# Patient Record
Sex: Male | Born: 1960 | Race: White | Hispanic: No | Smoking: Current every day smoker
Health system: Southern US, Community
[De-identification: ages and names within clinical notes are randomized; demographics above are authoritative.]

## PROBLEM LIST (undated history)

## (undated) DIAGNOSIS — G510 Bell's palsy: Secondary | ICD-10-CM

## (undated) HISTORY — DX: Bell's palsy: G51.0

---

## 2007-09-02 ENCOUNTER — Emergency Department (HOSPITAL_COMMUNITY): Admission: EM | Admit: 2007-09-02 | Discharge: 2007-09-02 | Payer: Self-pay | Admitting: *Deleted

## 2014-02-17 ENCOUNTER — Emergency Department (HOSPITAL_COMMUNITY): Payer: BC Managed Care – PPO

## 2014-02-17 ENCOUNTER — Encounter (HOSPITAL_COMMUNITY): Payer: Self-pay | Admitting: Emergency Medicine

## 2014-02-17 ENCOUNTER — Emergency Department (HOSPITAL_COMMUNITY)
Admission: EM | Admit: 2014-02-17 | Discharge: 2014-02-18 | Disposition: A | Discharge status: Home / Self Care | Payer: BC Managed Care – PPO | Attending: Emergency Medicine | Admitting: Emergency Medicine

## 2014-02-17 DIAGNOSIS — R079 Chest pain, unspecified: Secondary | ICD-10-CM

## 2014-02-17 DIAGNOSIS — R911 Solitary pulmonary nodule: Secondary | ICD-10-CM | POA: Insufficient documentation

## 2014-02-17 DIAGNOSIS — F17200 Nicotine dependence, unspecified, uncomplicated: Secondary | ICD-10-CM | POA: Insufficient documentation

## 2014-02-17 DIAGNOSIS — R918 Other nonspecific abnormal finding of lung field: Secondary | ICD-10-CM

## 2014-02-17 DIAGNOSIS — R071 Chest pain on breathing: Secondary | ICD-10-CM | POA: Insufficient documentation

## 2014-02-17 MED ORDER — NITROGLYCERIN 0.4 MG SL SUBL
0.4000 mg | SUBLINGUAL_TABLET | SUBLINGUAL | Status: AC | PRN
Start: 2014-02-17 — End: 2014-02-18
  Administered 2014-02-17 – 2014-02-18 (×3): 0.4 mg via SUBLINGUAL

## 2014-02-17 MED ORDER — ASPIRIN 81 MG PO CHEW
324.0000 mg | CHEWABLE_TABLET | Freq: Once | ORAL | Status: AC
  Administered 2014-02-17: 324 mg via ORAL
  Filled 2014-02-17: qty 4

## 2014-02-17 NOTE — ED Provider Notes (Signed)
CSN: [id number]     Arrival date & time 02/17/14  2309 History   First MD Initiated Contact with Patient 02/17/14 2331 This chart was scribed for Dione Booze, MD by Valera Castle, ED Scribe. This patient was seen in room APA02/APA02 and the patient's care was started at 11:34 PM.     Chief Complaint  Patient presents with  . Chest Pain   (Consider location/radiation/quality/duration/timing/severity/associated sxs/prior Treatment) The history is provided by the patient. No language interpreter was used.   HPI Comments: Oscar Rios is a 53 y.o. male who presents to the Emergency Department complaining of constant, left sided chest pain that radiates into his back, onset 1 hour ago after the pain woke him up from sleeping.  He reports currently having 6/10 chest pain, 9/10 at its worst. He denies his chest pain radiating into his jaw, shoulder. He reports deep breathing exacerbates his chest pain. He denies movement exacerbating his pain. He reports his father had heart problems later in life. His father passed in his 48's. He denies SOB, nausea, vomiting, diaphoresis, cough, and any other associated symptoms. He denies recent long distance travel. He reports h/o smoking 1 PPD. He denies h/o HTN, DM. other medical history. He reports he doesn't go to the doctors much.   PCP - No primary provider on file.   History reviewed. No pertinent past medical history. History reviewed. No pertinent past surgical history. No family history on file. History  Substance Use Topics  . Smoking status: Current Every Day Smoker    Types: Cigarettes  . Smokeless tobacco: Not on file  . Alcohol Use: Yes     Comment: occ    Review of Systems  All other systems reviewed and are negative.  Allergies  Review of patient's allergies indicates no known allergies.  Home Medications   Prior to Admission medications   Not on File   BP 140/81  Pulse 69  Temp(Src) 98.3 F (36.8 C) (Oral)  Resp 22  Ht 5\' 4"   (1.626 m)  Wt 135 lb (61.236 kg)  BMI 23.16 kg/m2  SpO2 99%  Physical Exam  Nursing note and vitals reviewed. Constitutional: He is oriented to person, place, and time. He appears well-developed and well-nourished. No distress.  HENT:  Head: Normocephalic and atraumatic.  Eyes: EOM are normal.  Neck: Neck supple.  Cardiovascular: Normal rate and regular rhythm.   No murmur heard. Pulmonary/Chest: Effort normal and breath sounds normal. No respiratory distress. He has no wheezes. He has no rales. He exhibits tenderness.  Mild left anterior chest wall tenderness.   Musculoskeletal: Normal range of motion.  Neurological: He is alert and oriented to person, place, and time.  Skin: Skin is warm and dry.  Psychiatric: He has a normal mood and affect. His behavior is normal.    ED Course  Procedures (including critical care time)  DIAGNOSTIC STUDIES: Oxygen Saturation is 99% on room air, normal by my interpretation.    COORDINATION OF CARE: 11:38 PM-Discussed treatment plan which includes Aspirin, Nitroglycerin pills, and blood work with pt at bedside and pt agreed to plan.   Results for orders placed during the hospital encounter of 02/17/14  CBC WITH DIFFERENTIAL      Result Value Ref Range   WBC 6.8  4.0 - 10.5 K/uL   RBC 4.65  4.22 - 5.81 MIL/uL   Hemoglobin 14.3  13.0 - 17.0 g/dL   HCT 40.9  81.1 - 91.4 %   MCV 91.0  78.0 -  100.0 fL   MCH 30.8  26.0 - 34.0 pg   MCHC 33.8  30.0 - 36.0 g/dL   RDW 40.913.5  81.111.5 - 91.415.5 %   Platelets 206  150 - 400 K/uL   Neutrophils Relative % 54  43 - 77 %   Neutro Abs 3.6  1.7 - 7.7 K/uL   Lymphocytes Relative 32  12 - 46 %   Lymphs Abs 2.2  0.7 - 4.0 K/uL   Monocytes Relative 12  3 - 12 %   Monocytes Absolute 0.8  0.1 - 1.0 K/uL   Eosinophils Relative 2  0 - 5 %   Eosinophils Absolute 0.1  0.0 - 0.7 K/uL   Basophils Relative 0  0 - 1 %   Basophils Absolute 0.0  0.0 - 0.1 K/uL  BASIC METABOLIC PANEL      Result Value Ref Range   Sodium  141  137 - 147 mEq/L   Potassium 4.0  3.7 - 5.3 mEq/L   Chloride 103  96 - 112 mEq/L   CO2 24  19 - 32 mEq/L   Glucose, Bld 106 (*) 70 - 99 mg/dL   BUN 17  6 - 23 mg/dL   Creatinine, Ser 7.820.91  0.50 - 1.35 mg/dL   Calcium 9.3  8.4 - 95.610.5 mg/dL   GFR calc non Af Amer >90  >90 mL/min   GFR calc Af Amer >90  >90 mL/min  TROPONIN I      Result Value Ref Range   Troponin I <0.30  <0.30 ng/mL  D-DIMER, QUANTITATIVE      Result Value Ref Range   D-Dimer, Quant 1.73 (*) 0.00 - 0.48 ug/mL-FEU  TROPONIN I      Result Value Ref Range   Troponin I <0.30  <0.30 ng/mL   Dg Chest 2 View  02/18/2014   CLINICAL DATA:  CHEST PAIN  EXAM: CHEST  2 VIEW  COMPARISON:  None.  FINDINGS: Cardiac silhouette within the upper limits normal. There is flattening of the hemidiaphragms. Lungs are clear. No acute osseous abnormalities.  IMPRESSION: COPD without acute cardiopulmonary disease.   Electronically Signed   By: Salome HolmesHector  Cooper M.D.   On: 02/18/2014 01:09   Ct Angio Chest Pe W/cm &/or Wo Cm  02/18/2014   CLINICAL DATA:  chest pain, elevated D-dimer.  EXAM: CT ANGIOGRAPHY CHEST WITH CONTRAST  TECHNIQUE: Multidetector CT imaging of the chest was performed using the standard protocol during bolus administration of intravenous contrast. Multiplanar CT image reconstructions and MIPs were obtained to evaluate the vascular anatomy.  CONTRAST:  100mL OMNIPAQUE IOHEXOL 350 MG/ML SOLN  COMPARISON:  DG CHEST 2 VIEW dated 02/18/2014  FINDINGS: The thoracic is unremarkable.  There is no mediastinal nor hilar masses or adenopathy.  No filling defects within the main, lobar, or segmental pulmonary arteries. There is no thoracic aortic aneurysm nor dissection.  Multiple ill-defined nodular densities scattered throughout the right and left hemithoraces. The areas of mild increased density with linear components in the posterior lung bases. The central airways are patent.  Visualized upper abdominal viscera are unremarkable.  No  aggressive appearing osseous lesions.  Visualized upper abdominal viscera demonstrate no gross abnormalities.  Review of the MIP images confirms the above findings.  IMPRESSION: Multifocal bilateral nodular densities. Differential consideration is bronchopulmonary pneumonitis. Short-term follow-up in 3-4 weeks status post appropriate therapeutic regimen is recommended.  No CT evidence of pulmonary arterial embolic disease.  Scarring versus atelectasis within the lung bases.  Electronically Signed   By: Salome HolmesHector  Cooper M.D.   On: 02/18/2014 03:25   Images viewed by me.    EKG Interpretation   Date/Time:  Tuesday Feb 17 2014 [id number]:[id number]:24 EDT Ventricular Rate:  71 PR Interval:  130 QRS Duration: 82 QT Interval:  378 QTC Calculation: 410 R Axis:   65 Text Interpretation:  Normal sinus rhythm Possible Left atrial enlargement  Borderline ECG No old tracing to compare Confirmed by [facility]  MD, Jeb Schloemer  ([id number]) on 02/17/2014 11:31:51 PM     Medications  aspirin chewable tablet 324 mg (324 mg Oral Given 02/17/14 2352)  nitroGLYCERIN (NITROSTAT) SL tablet 0.4 mg (0.4 mg Sublingual Given 02/18/14 0038)  iohexol (OMNIPAQUE) 350 MG/ML injection 100 mL (100 mLs Intravenous Contrast Given 02/18/14 0159)   MDM   Final diagnoses:  Chest pain  Lung nodules    Chest pain of uncertain cause. Pain is pleuritic in d-dimer will be obtained to rule out pulmonary embolism. You also be given a therapeutic trial of nitroglycerin. He is given initial dose of aspirin.  He did have relief of pain with nitroglycerin. However her ECG is normal and troponin is normal. D-dimer is elevated and CT angiogram is obtained showing some nodules with radiologist is concerned could be infectious. Troponin is repeated and is negative. He has been pain-free since the administration of nitroglycerin. He will need a stress test to evaluate for possible cardiac disease and is referred to [facility] cardiology for outpatient  stress testing. He is given prescriptions for prednisone and azithromycin and is advised that you'll need followup evaluation and make sure his lung nodules have responded. He is advised to take aspirin daily until cleared by cardiology.  I personally performed the services described in this documentation, which was scribed in my presence. The recorded information has been reviewed and is accurate.     Dione Boozeavid Jancie Kercher, MD 02/18/14 [id number]

## 2014-02-17 NOTE — ED Notes (Signed)
Patient states he was awakened by chest pressure about an hour ago.

## 2014-02-18 ENCOUNTER — Emergency Department (HOSPITAL_COMMUNITY): Payer: BC Managed Care – PPO

## 2014-02-18 LAB — CBC WITH DIFFERENTIAL/PLATELET
BASOS ABS: 0 10*3/uL (ref 0.0–0.1)
Basophils Relative: 0 % (ref 0–1)
EOS PCT: 2 % (ref 0–5)
Eosinophils Absolute: 0.1 10*3/uL (ref 0.0–0.7)
HEMATOCRIT: 42.3 % (ref 39.0–52.0)
HEMOGLOBIN: 14.3 g/dL (ref 13.0–17.0)
LYMPHS ABS: 2.2 10*3/uL (ref 0.7–4.0)
LYMPHS PCT: 32 % (ref 12–46)
MCH: 30.8 pg (ref 26.0–34.0)
MCHC: 33.8 g/dL (ref 30.0–36.0)
MCV: 91 fL (ref 78.0–100.0)
MONO ABS: 0.8 10*3/uL (ref 0.1–1.0)
MONOS PCT: 12 % (ref 3–12)
Neutro Abs: 3.6 10*3/uL (ref 1.7–7.7)
Neutrophils Relative %: 54 % (ref 43–77)
Platelets: 206 10*3/uL (ref 150–400)
RBC: 4.65 MIL/uL (ref 4.22–5.81)
RDW: 13.5 % (ref 11.5–15.5)
WBC: 6.8 10*3/uL (ref 4.0–10.5)

## 2014-02-18 LAB — TROPONIN I: Troponin I: <0.3 ng/mL (ref <0.30)

## 2014-02-18 LAB — BASIC METABOLIC PANEL
BUN: 17 mg/dL (ref 6–23)
CO2: 24 mEq/L (ref 19–32)
Calcium: 9.3 mg/dL (ref 8.4–10.5)
Chloride: 103 mEq/L (ref 96–112)
Creatinine, Ser: 0.91 mg/dL (ref 0.50–1.35)
GFR calc Af Amer: >90 mL/min (ref >90)
Glucose, Bld: 106 mg/dL — ABNORMAL HIGH (ref 70–99)
POTASSIUM: 4 meq/L (ref 3.7–5.3)
Sodium: 141 mEq/L (ref 137–147)

## 2014-02-18 LAB — D-DIMER, QUANTITATIVE (NOT AT ARMC): D DIMER QUANT: 1.73 ug{FEU}/mL — AB (ref 0.00–0.48)

## 2014-02-18 MED ORDER — PREDNISONE 20 MG PO TABS: 60.0000 mg | ORAL_TABLET | Freq: Every day | ORAL | Status: DC

## 2014-02-18 MED ORDER — AZITHROMYCIN 250 MG PO TABS: 250.0000 mg | ORAL_TABLET | Freq: Every day | ORAL | Status: DC

## 2014-02-18 MED ORDER — IOHEXOL 350 MG/ML SOLN
100.0000 mL | Freq: Once | INTRAVENOUS | Status: AC | PRN
  Administered 2014-02-18: 100 mL via INTRAVENOUS

## 2014-02-18 MED ORDER — PREDNISONE 50 MG PO TABS: 50.0000 mg | ORAL_TABLET | Freq: Every day | ORAL | Status: DC

## 2014-02-18 NOTE — Discharge Instructions (Signed)
Please take Aspirin 81 mg every day until you are cleared by a cardiologist. Oscar Rios CT scan showed some nodules which may be from infection or inflammation. This needs to be rechecked in one month to make sure it has improved with treatment.   Chest Pain (Nonspecific) It is often hard to give a specific diagnosis for the cause of chest pain. There is always a chance that your pain could be related to something serious, such as a heart attack or a blood clot in the lungs. You need to follow up with your caregiver for further evaluation. CAUSES   Heartburn.  Pneumonia or bronchitis.  Anxiety or stress.  Inflammation around your heart (pericarditis) or lung (pleuritis or pleurisy).  A blood clot in the lung.  A collapsed lung (pneumothorax). It can develop suddenly on its own (spontaneous pneumothorax) or from injury (trauma) to the chest.  Shingles infection (herpes zoster virus). The chest wall is composed of bones, muscles, and cartilage. Any of these can be the source of the pain.  The bones can be bruised by injury.  The muscles or cartilage can be strained by coughing or overwork.  The cartilage can be affected by inflammation and become sore (costochondritis). DIAGNOSIS  Lab tests or other studies, such as X-rays, electrocardiography, stress testing, or cardiac imaging, may be needed to find the cause of your pain.  TREATMENT   Treatment depends on what may be causing your chest pain. Treatment may include:  Acid blockers for heartburn.  Anti-inflammatory medicine.  Pain medicine for inflammatory conditions.  Antibiotics if an infection is present.  You may be advised to change lifestyle habits. This includes stopping smoking and avoiding alcohol, caffeine, and chocolate.  You may be advised to keep your head raised (elevated) when sleeping. This reduces the chance of acid going backward from your stomach into your esophagus.  Most of the time, nonspecific chest pain  will improve within 2 to 3 days with rest and mild pain medicine. HOME CARE INSTRUCTIONS   If antibiotics were prescribed, take your antibiotics as directed. Finish them even if you start to feel better.  For the next few days, avoid physical activities that bring on chest pain. Continue physical activities as directed.  Do not smoke.  Avoid drinking alcohol.  Only take over-the-counter or prescription medicine for pain, discomfort, or fever as directed by your caregiver.  Follow your caregiver's suggestions for further testing if your chest pain does not go away.  Keep any follow-up appointments you made. If you do not go to an appointment, you could develop lasting (chronic) problems with pain. If there is any problem keeping an appointment, you must call to reschedule. SEEK MEDICAL CARE IF:   You think you are having problems from the medicine you are taking. Read your medicine instructions carefully.  Your chest pain does not go away, even after treatment.  You develop a rash with blisters on your chest. SEEK IMMEDIATE MEDICAL CARE IF:   You have increased chest pain or pain that spreads to your arm, neck, jaw, back, or abdomen.  You develop shortness of breath, an increasing cough, or you are coughing up blood.  You have severe back or abdominal pain, feel nauseous, or vomit.  You develop severe weakness, fainting, or chills.  You have a fever. THIS IS AN EMERGENCY. Do not wait to see if the pain will go away. Get medical help at once. Call your local emergency services (911 in [place] Do not drive yourself  to the hospital. MAKE SURE YOU:   Understand these instructions.  Will watch your condition.  Will get help right away if you are not doing well or get worse. Document Released: 06/28/2005 Document Revised: 12/11/2011 Document Reviewed: 04/23/2008 [facility] Patient Information 2014 [place], [place].  Pulmonary Nodule A pulmonary nodule is a small, round growth of  tissue in the lung. Pulmonary nodules can range in size from less than 1/5 inch (4 mm) to a little bigger than an inch (25 mm). Most pulmonary nodules are detected when imaging tests of the lung are being performed for a different problem. Pulmonary nodules are usually not cancerous (benign). However, some pulmonary nodules are cancerous (malignant). Follow-up treatment or testing is based on the size of the pulmonary nodule and your risk of getting lung cancer.  CAUSES Benign pulmonary nodules can be caused by various things. Some of the causes include:   Bacterial, fungal, or viral infections. This is usually an old infection that is no longer active, but it can sometimes be a current, active infection.  A benign mass of tissue.  Inflammation from conditions such as rheumatoid arthritis.   Abnormal blood vessels in the lungs. Malignant pulmonary nodules can result from lung cancer or from cancers that spread to the lung from other places in the body. SIGNS AND SYMPTOMS Pulmonary nodules usually do not cause symptoms. DIAGNOSIS Most often, pulmonary nodules are found incidentally when an X-ray or CT scan is performed to look for some other problem in the lung area. To help determine whether a pulmonary nodule is benign or malignant, your health care provider will take a medical history and order a variety of tests. Tests done may include:   Blood tests.  A skin test called a tuberculin test. This test is used to determine if you have been exposed to the germ that causes tuberculosis.   Chest X-rays. If possible, a new X-ray may be compared with X-rays you have had in the past.   CT scan. This test shows smaller pulmonary nodules more clearly than an X-ray.   Positron emission tomography (PET) scan. In this test, a safe amount of a radioactive substance is injected into the bloodstream. Then, the scan takes a picture of the pulmonary nodule. The radioactive substance is eliminated from  your body in your urine.   Biopsy. A tiny piece of the pulmonary nodule is removed so it can be checked under a microscope. TREATMENT  Pulmonary nodules that are benign normally do not require any treatment because they usually do not cause symptoms or breathing problems. Your health care provider may want to monitor the pulmonary nodule through follow-up CT scans. The frequency of these CT scans will vary based on the size of the nodule and the risk factors for lung cancer. For example, CT scans will need to be done more frequently if the pulmonary nodule is larger and if you have a history of smoking and a family history of cancer. Further testing or biopsies may be done if any follow-up CT scan shows that the size of the pulmonary nodule has increased. HOME CARE INSTRUCTIONS  Only take over-the-counter or prescription medicines as directed by your health care provider.  Keep all follow-up appointments with your health care provider. SEEK MEDICAL CARE IF:  You have trouble breathing when you are active.   You feel sick or unusually tired.   You do not feel like eating.   You lose weight without trying to.   You develop chills  or night sweats.  SEEK IMMEDIATE MEDICAL CARE IF:  You cannot catch your breath, or you begin wheezing.   You cannot stop coughing.   You cough up blood.   You become dizzy or feel like you are going to pass out.   You have sudden chest pain.   You have a fever or persistent symptoms for more than 2 3 days.   You have a fever and your symptoms suddenly get worse. MAKE SURE YOU:  Understand these instructions.  Will watch your condition.  Will get help right away if you are not doing well or get worse. Document Released: 07/16/2009 Document Revised: 05/21/2013 Document Reviewed: 03/10/2013 [facility] Patient Information 2014 [place], [place].  Azithromycin tablets What is this medicine? AZITHROMYCIN (az ith roe MYE sin) is a macrolide  antibiotic. It is used to treat or prevent certain kinds of bacterial infections. It will not work for colds, flu, or other viral infections. This medicine may be used for other purposes; ask your health care provider or pharmacist if you have questions. COMMON BRAND NAME(S): Zithromax Tri-Pak, Zithromax Z-Pak, Zithromax What should I tell my health care provider before I take this medicine? They need to know if you have any of these conditions: -kidney disease -liver disease -irregular heartbeat or heart disease -an unusual or allergic reaction to azithromycin, erythromycin, other macrolide antibiotics, foods, dyes, or preservatives -pregnant or trying to get pregnant -breast-feeding How should I use this medicine? Take this medicine by mouth with a full glass of water. Follow the directions on the prescription label. The tablets can be taken with food or on an empty stomach. If the medicine upsets your stomach, take it with food. Take your medicine at regular intervals. Do not take your medicine more often than directed. Take all of your medicine as directed even if you think your are better. Do not skip doses or stop your medicine early. Talk to your pediatrician regarding the use of this medicine in children. Special care may be needed. Overdosage: If you think you have taken too much of this medicine contact a poison control center or emergency room at once. NOTE: This medicine is only for you. Do not share this medicine with others. What if I miss a dose? If you miss a dose, take it as soon as you can. If it is almost time for your next dose, take only that dose. Do not take double or extra doses. What may interact with this medicine? Do not take this medicine with any of the following medications: -lincomycin This medicine may also interact with the following medications: -amiodarone -antacids -cyclosporine -digoxin -magnesium -nelfinavir -phenytoin -warfarin This list may not  describe all possible interactions. Give your health care provider a list of all the medicines, herbs, non-prescription drugs, or dietary supplements you use. Also tell them if you smoke, drink alcohol, or use illegal drugs. Some items may interact with your medicine. What should I watch for while using this medicine? Tell your doctor or health care professional if your symptoms do not improve. Do not treat diarrhea with over the counter products. Contact your doctor if you have diarrhea that lasts more than 2 days or if it is severe and watery. This medicine can make you more sensitive to the sun. Keep out of the sun. If you cannot avoid being in the sun, wear protective clothing and use sunscreen. Do not use sun lamps or tanning beds/booths. What side effects may I notice from receiving this medicine? Side  effects that you should report to your doctor or health care professional as soon as possible: -allergic reactions like skin rash, itching or hives, swelling of the face, lips, or tongue -confusion, nightmares or hallucinations -dark urine -difficulty breathing -hearing loss -irregular heartbeat or chest pain -pain or difficulty passing urine -redness, blistering, peeling or loosening of the skin, including inside the mouth -white patches or sores in the mouth -yellowing of the eyes or skin Side effects that usually do not require medical attention (report to your doctor or health care professional if they continue or are bothersome): -diarrhea -dizziness, drowsiness -headache -stomach upset or vomiting -tooth discoloration -vaginal irritation This list may not describe all possible side effects. Call your doctor for medical advice about side effects. You may report side effects to FDA at 1-800-FDA-1088. Where should I keep my medicine? Keep out of the reach of children. Store at room temperature between 15 and 30 degrees C (59 and 86 degrees F). Throw away any unused medicine after  the expiration date. NOTE: This sheet is a summary. It may not cover all possible information. If you have questions about this medicine, talk to your doctor, pharmacist, or health care provider.  2014, Elsevier/Gold Standard. (2013-03-12 15:42:07)  Prednisone tablets What is this medicine? PREDNISONE (PRED ni sone) is a corticosteroid. It is commonly used to treat inflammation of the skin, joints, lungs, and other organs. Common conditions treated include asthma, allergies, and arthritis. It is also used for other conditions, such as blood disorders and diseases of the adrenal glands. This medicine may be used for other purposes; ask your health care provider or pharmacist if you have questions. COMMON BRAND NAME(S): Deltasone, Predone, Sterapred DS, Sterapred What should I tell my health care provider before I take this medicine? They need to know if you have any of these conditions: -Cushing's syndrome -diabetes -glaucoma -heart disease -high blood pressure -infection (especially a virus infection such as chickenpox, cold sores, or herpes) -kidney disease -liver disease -mental illness -myasthenia gravis -osteoporosis -seizures -stomach or intestine problems -thyroid disease -an unusual or allergic reaction to lactose, prednisone, other medicines, foods, dyes, or preservatives -pregnant or trying to get pregnant -breast-feeding How should I use this medicine? Take this medicine by mouth with a glass of water. Follow the directions on the prescription label. Take this medicine with food. If you are taking this medicine once a day, take it in the morning. Do not take more medicine than you are told to take. Do not suddenly stop taking your medicine because you may develop a severe reaction. Your doctor will tell you how much medicine to take. If your doctor wants you to stop the medicine, the dose may be slowly lowered over time to avoid any side effects. Talk to your pediatrician  regarding the use of this medicine in children. Special care may be needed. Overdosage: If you think you have taken too much of this medicine contact a poison control center or emergency room at once. NOTE: This medicine is only for you. Do not share this medicine with others. What if I miss a dose? If you miss a dose, take it as soon as you can. If it is almost time for your next dose, talk to your doctor or health care professional. You may need to miss a dose or take an extra dose. Do not take double or extra doses without advice. What may interact with this medicine? Do not take this medicine with any of the following medications: -  metyrapone -mifepristone This medicine may also interact with the following medications: -aminoglutethimide -amphotericin B -aspirin and aspirin-like medicines -barbiturates -certain medicines for diabetes, like glipizide or glyburide -cholestyramine -cholinesterase inhibitors -cyclosporine -digoxin -diuretics -ephedrine -male hormones, like estrogens and birth control pills -isoniazid -ketoconazole -NSAIDS, medicines for pain and inflammation, like ibuprofen or naproxen -phenytoin -rifampin -toxoids -vaccines -warfarin This list may not describe all possible interactions. Give your health care provider a list of all the medicines, herbs, non-prescription drugs, or dietary supplements you use. Also tell them if you smoke, drink alcohol, or use illegal drugs. Some items may interact with your medicine. What should I watch for while using this medicine? Visit your doctor or health care professional for regular checks on your progress. If you are taking this medicine over a prolonged period, carry an identification card with your name and address, the type and dose of your medicine, and your doctor's name and address. This medicine may increase your risk of getting an infection. Tell your doctor or health care professional if you are around anyone with  measles or chickenpox, or if you develop sores or blisters that do not heal properly. If you are going to have surgery, tell your doctor or health care professional that you have taken this medicine within the last twelve months. Ask your doctor or health care professional about your diet. You may need to lower the amount of salt you eat. This medicine may affect blood sugar levels. If you have diabetes, check with your doctor or health care professional before you change your diet or the dose of your diabetic medicine. What side effects may I notice from receiving this medicine? Side effects that you should report to your doctor or health care professional as soon as possible: -allergic reactions like skin rash, itching or hives, swelling of the face, lips, or tongue -changes in emotions or moods -changes in vision -depressed mood -eye pain -fever or chills, cough, sore throat, pain or difficulty passing urine -increased thirst -swelling of ankles, feet Side effects that usually do not require medical attention (report to your doctor or health care professional if they continue or are bothersome): -confusion, excitement, restlessness -headache -nausea, vomiting -skin problems, acne, thin and shiny skin -trouble sleeping -weight gain This list may not describe all possible side effects. Call your doctor for medical advice about side effects. You may report side effects to FDA at 1-800-FDA-1088. Where should I keep my medicine? Keep out of the reach of children. Store at room temperature between 15 and 30 degrees C (59 and 86 degrees F). Protect from light. Keep container tightly closed. Throw away any unused medicine after the expiration date. NOTE: This sheet is a summary. It may not cover all possible information. If you have questions about this medicine, talk to your doctor, pharmacist, or health care provider.  2014, Elsevier/Gold Standard. (2011-05-04 10:57:14)

## 2014-03-10 ENCOUNTER — Encounter: Payer: Self-pay | Admitting: *Deleted

## 2014-03-13 ENCOUNTER — Telehealth: Payer: Self-pay | Admitting: Cardiology

## 2014-03-13 ENCOUNTER — Ambulatory Visit (INDEPENDENT_AMBULATORY_CARE_PROVIDER_SITE_OTHER): Payer: BC Managed Care – PPO | Admitting: Cardiology

## 2014-03-13 ENCOUNTER — Encounter: Payer: Self-pay | Admitting: Cardiology

## 2014-03-13 VITALS — BP 140/78 | HR 70 | Ht 64.0 in | Wt 132.0 lb

## 2014-03-13 DIAGNOSIS — R079 Chest pain, unspecified: Secondary | ICD-10-CM | POA: Insufficient documentation

## 2014-03-13 DIAGNOSIS — R9389 Abnormal findings on diagnostic imaging of other specified body structures: Secondary | ICD-10-CM

## 2014-03-13 DIAGNOSIS — R918 Other nonspecific abnormal finding of lung field: Secondary | ICD-10-CM | POA: Insufficient documentation

## 2014-03-13 NOTE — Telephone Encounter (Signed)
Left message for pt to call regarding appointment for CT of Chest that was ordered by Dr. Antoine PocheHochrein.

## 2014-03-13 NOTE — Patient Instructions (Addendum)
The current medical regimen is effective;  continue present plan and medications.  Non-Cardiac CT scanning, (CAT scanning), is a noninvasive, special x-ray that produces cross-sectional images of the body using x-rays and a computer at [facility]. CT scans help physicians diagnose and treat medical conditions. For some CT exams, a contrast material is used to enhance visibility in the area of the body being studied. CT scans provide greater clarity and reveal more details than regular x-ray exams.  Follow up as needed.

## 2014-03-13 NOTE — Progress Notes (Signed)
HPI  The patient presents for evaluation of chest discomfort.He has had no past cardiac history. He was in the emergency room in May and I have reviewed these records. He had some chest discomfort that was felt to be somewhat atypical. Because he did have an increased d-dimer we had a chest CT. There was no evidence of pulmonary embolism.  There were however multifocal nodular bilateral densities. He was treated with antibiotics and steroids. He said he immediately had improvement in his symptoms he was having. He said that the symptoms was chest discomfort that he had acutely today he presented. It was somewhat sharp. He felt like he had some difficulty taking a deep breath. However, he had never had this before. He hasn't had this since. He's had no cough fevers or chills. He's had no shortness of breath, PND or orthopnea. He's had no swelling or edema. He is very active gentleman. He works vigorously without bringing on any of the symptoms.  No Known Allergies  No current outpatient prescriptions on file.   No current facility-administered medications for this visit.    PMH:  None  PSH:  None   History   Social History  . Marital Status: Single    Spouse Name: N/A    Number of Children: 2  . Years of Education: N/A   Occupational History  .  Other   Social History Main Topics  . Smoking status: Current Every Day Smoker -- 1.00 packs/day for 25 years    Types: Cigarettes  . Smokeless tobacco: Not on file  . Alcohol Use: Yes     Comment: occ  . Drug Use: No  . Sexual Activity: Not on file   Other Topics Concern  . Not on file   Social History Narrative   Two sons live with him.      ROS:  As stated in the HPI and negative for all other systems.  PHYSICAL EXAM BP 140/78  Pulse 70  Ht 5\' 4"  (1.626 m)  Wt 132 lb (59.875 kg)  BMI 22.65 kg/m2 GENERAL:  Well appearing HEENT:  Pupils equal round and reactive, fundi not visualized, oral mucosa unremarkable NECK:  No  jugular venous distention, waveform within normal limits, carotid upstroke brisk and symmetric, no bruits, no thyromegaly LYMPHATICS:  No cervical, inguinal adenopathy LUNGS:  Clear to auscultation bilaterally BACK:  No CVA tenderness CHEST:  Unremarkable HEART:  PMI not displaced or sustained,S1 and S2 within normal limits, no S3, no S4, no clicks, no rubs, no murmurs ABD:  Flat, positive bowel sounds normal in frequency in pitch, no bruits, no rebound, no guarding, no midline pulsatile mass, no hepatomegaly, no splenomegaly EXT:  2 plus pulses throughout, no edema, no cyanosis no clubbing SKIN:  No rashes no nodules NEURO:  Cranial nerves II through XII grossly intact, motor grossly intact throughout PSYCH:  Cognitively intact, oriented to person place and time   EKG:  Sinus rhythm, rate 71, axis within normal limits, intervals within normal limits, no acute ST-T wave changes.  Possible LAE. 03/13/2014   ASSESSMENT AND PLAN  CHEST PAIN:  This was atypical. It happened 1 day and has not recurred. He otherwise can be very active without bringing on any of this. At this point no further cardiovascular testing is suggested. He needs aggressive risk reduction.  LUNG NODULES:  Per previous recommendations I will plan on repeating a CT to see if there has been resolution of this. If not he'll need to  see a pulmonologist.  TOBACCO ABUSE:  We did discuss this for quite a while. I suggested plans for quitting.

## 2014-03-16 ENCOUNTER — Telehealth: Payer: Self-pay | Admitting: Cardiology

## 2014-03-16 NOTE — Telephone Encounter (Signed)
Left message 03/13/14---03/16/14 regarding appointment for CT of Chest that was ordered by Dr. Antoine PocheHochrein.  This is scheduled for 03/19/14 @ 10:15 am----at [facility]--arrival time is 10:am--clear liquids only 4 hours prior to study.

## 2014-03-17 ENCOUNTER — Telehealth: Payer: Self-pay | Admitting: Cardiology

## 2014-03-17 NOTE — Telephone Encounter (Signed)
Left message regarding patient's Chest CT scan that is scheduled for 03/19/14 @ 10:15 am [facility].  Arrive time is 10:00 am at the registration desk at the main entrance of the hospital----Nothing to eat or drink 4 hrs. Prior to test.  Left call back number is patient has any questions.

## 2014-03-19 ENCOUNTER — Ambulatory Visit (HOSPITAL_COMMUNITY): Payer: BC Managed Care – PPO

## 2014-03-27 ENCOUNTER — Ambulatory Visit (HOSPITAL_COMMUNITY): Payer: BC Managed Care – PPO

## 2014-04-15 ENCOUNTER — Ambulatory Visit: Payer: Self-pay | Admitting: Cardiology

## 2014-09-29 ENCOUNTER — Encounter: Payer: Self-pay | Admitting: Family Medicine

## 2015-10-31 IMAGING — CT CT ANGIO CHEST
1 of 6 series · 5 of 36 positions shown · IV contrast (Omnipaque 300)
Comparison: DG CHEST 2 VIEW dated 02/18/2014

ADDENDUM:
There was a typographical error in the initial report. The
IMPRESSION should read BRONCHOPNEUMONIA NOT bronchopulmonary
pneumonitis.
CLINICAL DATA: chest pain, elevated D-dimer.

EXAM:
CT ANGIOGRAPHY CHEST WITH CONTRAST
TECHNIQUE: Multidetector CT imaging of the chest was performed using the
standard protocol during bolus administration of intravenous
contrast. Multiplanar CT image reconstructions and MIPs were
obtained to evaluate the vascular anatomy.
CONTRAST:  100mL OMNIPAQUE IOHEXOL 350 MG/ML SOLN

[Series 4: pe 3.0 b40f · axial · 0.59mm/px · z∈[-316,-145]mm · 5 of 87 slices shown]
[im 15/87  lung]
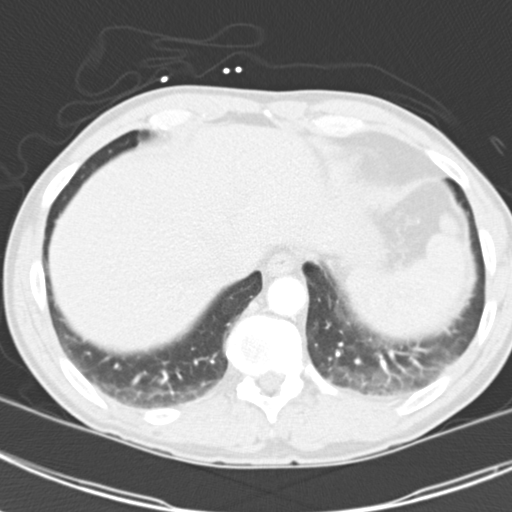
[im 29/87  mediastinal]
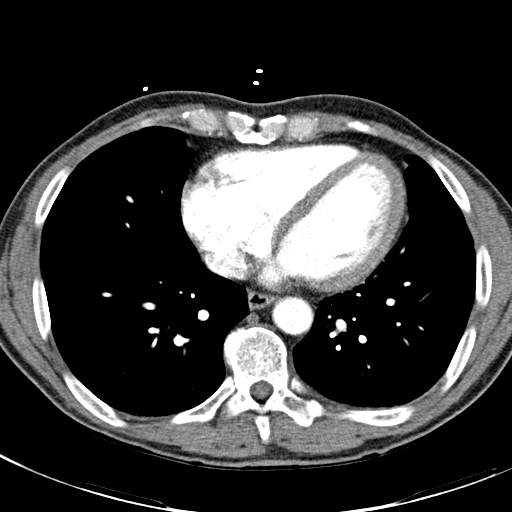
[im 44/87  lung]
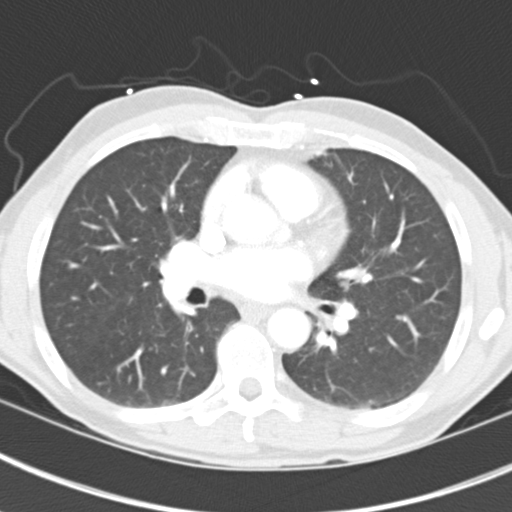
[im 58/87  mediastinal]
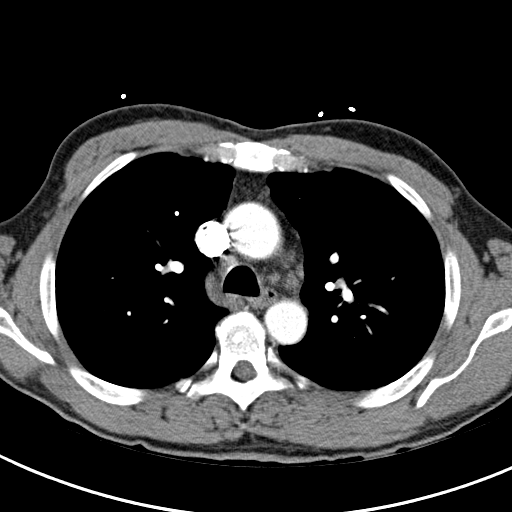
[im 72/87  lung]
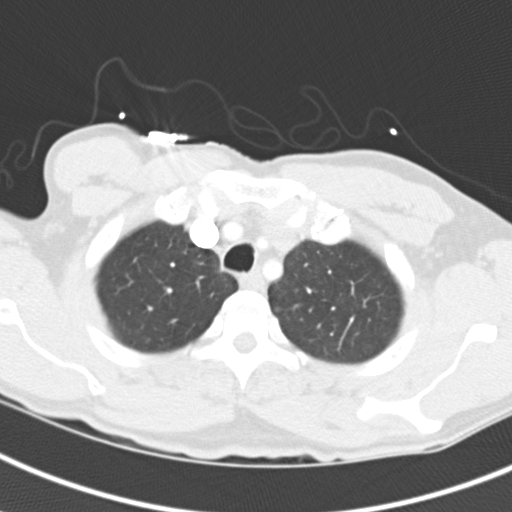

[5 of 36 positions shown; findings below may reference images not displayed]

FINDINGS: The thoracic is unremarkable.

There is no mediastinal nor hilar masses or adenopathy.

No filling defects within the main, lobar, or segmental pulmonary
arteries. There is no thoracic aortic aneurysm nor dissection.

Multiple ill-defined nodular densities scattered throughout the
right and left hemithoraces. The areas of mild increased density
with linear components in the posterior lung bases. The central
airways are patent.

Visualized upper abdominal viscera are unremarkable.

No aggressive appearing osseous lesions.

Visualized upper abdominal viscera demonstrate no gross
abnormalities.

Review of the MIP images confirms the above findings.
IMPRESSION: Multifocal bilateral nodular densities. Differential consideration
is bronchopulmonary pneumonitis. Short-term follow-up in 3-4 weeks
status post appropriate therapeutic regimen is recommended.

No CT evidence of pulmonary arterial embolic disease.

Scarring versus atelectasis within the lung bases.

## 2016-11-24 ENCOUNTER — Emergency Department (HOSPITAL_COMMUNITY)
Admission: EM | Admit: 2016-11-24 | Discharge: 2016-11-24 | Disposition: A | Discharge status: Home / Self Care | Payer: BLUE CROSS/BLUE SHIELD | Attending: Emergency Medicine | Admitting: Emergency Medicine

## 2016-11-24 ENCOUNTER — Emergency Department (HOSPITAL_COMMUNITY): Payer: BLUE CROSS/BLUE SHIELD

## 2016-11-24 ENCOUNTER — Encounter (HOSPITAL_COMMUNITY): Payer: Self-pay

## 2016-11-24 DIAGNOSIS — R202 Paresthesia of skin: Secondary | ICD-10-CM | POA: Diagnosis present

## 2016-11-24 DIAGNOSIS — G510 Bell's palsy: Secondary | ICD-10-CM

## 2016-11-24 DIAGNOSIS — F17210 Nicotine dependence, cigarettes, uncomplicated: Secondary | ICD-10-CM | POA: Diagnosis not present

## 2016-11-24 DIAGNOSIS — R29810 Facial weakness: Secondary | ICD-10-CM | POA: Diagnosis not present

## 2016-11-24 MED ORDER — VALACYCLOVIR HCL 1 G PO TABS: 1000.0000 mg | ORAL_TABLET | Freq: Three times a day (TID) | ORAL | 0 refills | Status: DC

## 2016-11-24 MED ORDER — ARTIFICIAL TEARS OP OINT: TOPICAL_OINTMENT | OPHTHALMIC | 1 refills | Status: DC | PRN

## 2016-11-24 MED ORDER — PREDNISONE 10 MG PO TABS
60.0000 mg | ORAL_TABLET | Freq: Once | ORAL | Status: AC
  Administered 2016-11-24: 60 mg via ORAL
  Filled 2016-11-24: qty 1

## 2016-11-24 MED ORDER — VALACYCLOVIR HCL 500 MG PO TABS
1000.0000 mg | ORAL_TABLET | Freq: Once | ORAL | Status: AC
  Administered 2016-11-24: 1000 mg via ORAL
  Filled 2016-11-24: qty 2

## 2016-11-24 MED ORDER — PREDNISONE 10 MG PO TABS: ORAL_TABLET | ORAL | 0 refills | Status: DC

## 2016-11-24 NOTE — ED Notes (Signed)
Informed Dr. Clarene DukeMcManus about patient. No new orders given.

## 2016-11-24 NOTE — Discharge Instructions (Signed)
Take the prescriptions as directed.  Call your regular medical doctor and the Neurologist on Monday to schedule a follow up appointment next week.  Return to the Emergency Department immediately sooner if worsening.

## 2016-11-24 NOTE — ED Provider Notes (Signed)
[facility]PT Provider Note   CSN: [id number] Arrival date & time: 11/24/16  2034     History   Chief Complaint Chief Complaint  Patient presents with  . Tingling  . Facial Droop    left, onset 0730    HPI Oscar Rios is a 56 y.o. male.  HPI  Pt was seen at 2055. Per pt, c/o gradual onset and persistence of constant left sided facial droop since 0730 this morning. Pt states he started with "tingling" and "pain" around his left ear and face several days ago. Pt states this morning, he noticed he was unable to close his left eyelid, and liquids ran out of the left side of his mouth. Pt states "it feels like I got a shot at the dentist." Denies any other symptoms. Denies dysphagia, no slurred speech, no focal motor weakness, no tingling/numbness in extremities, no fevers, no rash, no injury, no neck pain.    History reviewed. No pertinent past medical history.  Patient Active Problem List   Diagnosis Date Noted  . Chest pain 03/13/2014  . Lung nodules 03/13/2014    History reviewed. No pertinent surgical history.     Home Medications    Prior to Admission medications   Not on File    Family History Family History  Problem Relation Age of Onset  . Myasthenia gravis Father     Died age 83  . Heart disease Father     Heart valve replaced     Social History Social History  Substance Use Topics  . Smoking status: Current Every Day Smoker    Packs/day: 1.00    Years: 25.00    Types: Cigarettes  . Smokeless tobacco: Never Used  . Alcohol use Yes     Comment: occ     Allergies   Patient has no known allergies.   Review of Systems Review of Systems ROS: Statement: All systems negative except as marked or noted in the HPI; Constitutional: Negative for fever and chills. ; ; Eyes: Negative for eye pain, redness and discharge. ; ; ENMT: Negative for ear pain, hoarseness, nasal congestion, sinus pressure and sore throat. ; ; Cardiovascular: Negative for  chest pain, palpitations, diaphoresis, dyspnea and peripheral edema. ; ; Respiratory: Negative for cough, wheezing and stridor. ; ; Gastrointestinal: Negative for nausea, vomiting, diarrhea, abdominal pain, blood in stool, hematemesis, jaundice and rectal bleeding. . ; ; Genitourinary: Negative for dysuria, flank pain and hematuria. ; ; Musculoskeletal: Negative for back pain and neck pain. Negative for swelling and trauma.; ; Skin: Negative for pruritus, rash, abrasions, blisters, bruising and skin lesion.; ; Neuro: +left sided facial droop. Negative for headache, lightheadedness and neck stiffness. Negative for weakness, altered level of consciousness, altered mental status, extremity weakness, paresthesias, involuntary movement, seizure and syncope.       Physical Exam Updated Vital Signs BP 161/100 (BP Location: Left Arm)   Pulse 74   Temp 98.3 F (36.8 C) (Oral)   Resp 17   Ht 5\' 5"  (1.651 m)   Wt 135 lb (61.2 kg)   SpO2 99%   BMI 22.47 kg/m   Physical Exam 2100: Physical examination:  Nursing notes reviewed; Vital signs and O2 SAT reviewed;  Constitutional: Well developed, Well nourished, Well hydrated, In no acute distress; Head:  Normocephalic, atraumatic; Eyes: EOMI, PERRL, No scleral icterus; ENMT: TM's clear bilat. Mouth and pharynx normal, Mucous membranes moist; Neck: Supple, Full range of motion, No lymphadenopathy; Cardiovascular: Regular rate and rhythm, No gallop;  Respiratory: Breath sounds clear & equal bilaterally, No wheezes.  Speaking full sentences with ease, Normal respiratory effort/excursion; Chest: Nontender, Movement normal; Abdomen: Soft, Nontender, Nondistended, Normal bowel sounds; Genitourinary: No CVA tenderness; Extremities: Pulses normal, No tenderness, No edema, No calf edema or asymmetry.; Neuro: AA&Ox3, +left facial droop, paresis includes left forehead. Otherwise, major CN grossly intact.  Speech clear.  Grips equal. Strength 5/5 equal bilat UE's and LE's.   DTR 2/4 equal bilat UE's and LE's.  No gross sensory deficits.  Normal cerebellar testing bilat UE's (finger-nose) and LE's (heel-shin). .; Skin: Color normal, Warm, Dry.   ED Treatments / Results  Labs (all labs ordered are listed, but only abnormal results are displayed)   EKG  EKG Interpretation None       Radiology   Procedures Procedures (including critical care time)  Medications Ordered in ED Medications - No data to display   Initial Impression / Assessment and Plan / ED Course  I have reviewed the triage vital signs and the nursing notes.  Pertinent labs & imaging results that were available during my care of the patient were reviewed by me and considered in my medical decision making (see chart for details).  MDM Reviewed: previous chart, nursing note and vitals Interpretation: CT scan    Ct Head Wo Contrast Result Date: 11/24/2016 CLINICAL DATA:  Left facial droop since 7:30 a.m. EXAM: CT HEAD WITHOUT CONTRAST TECHNIQUE: Contiguous axial images were obtained from the base of the skull through the vertex without intravenous contrast. COMPARISON:  None. FINDINGS: Brain: The ventricles, cisterns and other CSF spaces are normal. There is no mass, mass effect, shift of midline structures or acute hemorrhage. No evidence of acute infarction. Vascular: Within normal. Skull: Within normal. Sinuses/Orbits: Orbits are normal. Hypoplastic frontal sinuses. Minimal opacification over the ethmoid sinus and left maxillary sinus. Mastoid air cells are clear. Other: None. IMPRESSION: No acute intracranial findings. Minimal chronic sinus inflammatory change. Electronically Signed   By: Elberta Fortisaniel  Boyle M.D.   On: 11/24/2016 21:48    2210:  CT as above. Tx for Bell's palsy. Dx and testing d/w pt and family.  Questions answered.  Verb understanding, agreeable to d/c home with outpt f/u.   Final Clinical Impressions(s) / ED Diagnoses   Final diagnoses:  None    New  Prescriptions New Prescriptions   No medications on file     Samuel JesterKathleen Alvia Jablonski, DO 11/27/16 [id number]

## 2016-11-24 NOTE — ED Triage Notes (Addendum)
Patient states he "woke up not feeling right". States he was at work and approx. 0730 this morning he was unable to close left eye/tinlging to left side of face. Noted to have left facial droop. Also noted to have blurred vision.  Also complains of headache.  A&Ox4.

## 2016-11-27 DIAGNOSIS — G510 Bell's palsy: Secondary | ICD-10-CM | POA: Diagnosis not present

## 2017-01-01 DIAGNOSIS — G510 Bell's palsy: Secondary | ICD-10-CM | POA: Diagnosis not present

## 2017-01-25 ENCOUNTER — Ambulatory Visit (INDEPENDENT_AMBULATORY_CARE_PROVIDER_SITE_OTHER): Payer: BLUE CROSS/BLUE SHIELD

## 2017-01-25 ENCOUNTER — Ambulatory Visit (INDEPENDENT_AMBULATORY_CARE_PROVIDER_SITE_OTHER): Payer: BLUE CROSS/BLUE SHIELD | Admitting: Family Medicine

## 2017-01-25 ENCOUNTER — Encounter: Payer: Self-pay | Admitting: Family Medicine

## 2017-01-25 VITALS — BP 139/90 | HR 75 | Temp 97.9°F | Ht 64.0 in | Wt 144.8 lb

## 2017-01-25 DIAGNOSIS — R252 Cramp and spasm: Secondary | ICD-10-CM

## 2017-01-25 DIAGNOSIS — Z1211 Encounter for screening for malignant neoplasm of colon: Secondary | ICD-10-CM

## 2017-01-25 DIAGNOSIS — Z0000 Encounter for general adult medical examination without abnormal findings: Secondary | ICD-10-CM | POA: Diagnosis not present

## 2017-01-25 DIAGNOSIS — F17200 Nicotine dependence, unspecified, uncomplicated: Secondary | ICD-10-CM

## 2017-01-25 DIAGNOSIS — R918 Other nonspecific abnormal finding of lung field: Secondary | ICD-10-CM | POA: Diagnosis not present

## 2017-01-25 DIAGNOSIS — G4700 Insomnia, unspecified: Secondary | ICD-10-CM

## 2017-01-25 DIAGNOSIS — IMO0001 Reserved for inherently not codable concepts without codable children: Secondary | ICD-10-CM

## 2017-01-25 MED ORDER — TRAZODONE HCL 100 MG PO TABS: 100.0000 mg | ORAL_TABLET | Freq: Every day | ORAL | 3 refills | Status: DC

## 2017-01-25 NOTE — Patient Instructions (Addendum)
Great to meet you!  Stopping smoking is the best thing you can do for your health  You will be called to make an appointment to set up a colonoscopy  Try trazodone 1/2 to 1 pill at night for sleep   Health Maintenance, Male A healthy lifestyle and preventive care is important for your health and wellness. Ask your health care provider about what schedule of regular examinations is right for you. What should I know about weight and diet?  Eat a Healthy Diet  Eat plenty of vegetables, fruits, whole grains, low-fat dairy products, and lean protein.  Do not eat a lot of foods high in solid fats, added sugars, or salt. Maintain a Healthy Weight  Regular exercise can help you achieve or maintain a healthy weight. You should:  Do at least 150 minutes of exercise each week. The exercise should increase your heart rate and make you sweat (moderate-intensity exercise).  Do strength-training exercises at least twice a week. Watch Your Levels of Cholesterol and Blood Lipids  Have your blood tested for lipids and cholesterol every 5 years starting at 58 years of age. If you are at high risk for heart disease, you should start having your blood tested when you are 58 years old. You may need to have your cholesterol levels checked more often if:  Your lipid or cholesterol levels are high.  You are older than 58 years of age.  You are at high risk for heart disease. What should I know about cancer screening? Many types of cancers can be detected early and may often be prevented. Lung Cancer  You should be screened every year for lung cancer if:  You are a current smoker who has smoked for at least 30 years.  You are a former smoker who has quit within the past 15 years.  Talk to your health care provider about your screening options, when you should start screening, and how often you should be screened. Colorectal Cancer  Routine colorectal cancer screening usually begins at 58 years of  age and should be repeated every 5-10 years until you are 58 years old. You may need to be screened more often if early forms of precancerous polyps or small growths are found. Your health care provider may recommend screening at an earlier age if you have risk factors for colon cancer.  Your health care provider may recommend using home test kits to check for hidden blood in the stool.  A small camera at the end of a tube can be used to examine your colon (sigmoidoscopy or colonoscopy). This checks for the earliest forms of colorectal cancer. Prostate and Testicular Cancer  Depending on your age and overall health, your health care provider may do certain tests to screen for prostate and testicular cancer.  Talk to your health care provider about any symptoms or concerns you have about testicular or prostate cancer. Skin Cancer  Check your skin from head to toe regularly.  Tell your health care provider about any new moles or changes in moles, especially if:  There is a change in a mole's size, shape, or color.  You have a mole that is larger than a pencil eraser.  Always use sunscreen. Apply sunscreen liberally and repeat throughout the day.  Protect yourself by wearing long sleeves, pants, a wide-brimmed hat, and sunglasses when outside. What should I know about heart disease, diabetes, and high blood pressure?  If you are 43-71 years of age, have your blood pressure  checked every 3-5 years. If you are 15 years of age or older, have your blood pressure checked every year. You should have your blood pressure measured twice-once when you are at a hospital or clinic, and once when you are not at a hospital or clinic. Record the average of the two measurements. To check your blood pressure when you are not at a hospital or clinic, you can use:  An automated blood pressure machine at a pharmacy.  A home blood pressure monitor.  Talk to your health care provider about your target blood  pressure.  If you are between 37-41 years old, ask your health care provider if you should take aspirin to prevent heart disease.  Have regular diabetes screenings by checking your fasting blood sugar level.  If you are at a normal weight and have a low risk for diabetes, have this test once every three years after the age of 75.  If you are overweight and have a high risk for diabetes, consider being tested at a younger age or more often.  A one-time screening for abdominal aortic aneurysm (AAA) by ultrasound is recommended for men aged 65-75 years who are current or former smokers. What should I know about preventing infection? Hepatitis B  If you have a higher risk for hepatitis B, you should be screened for this virus. Talk with your health care provider to find out if you are at risk for hepatitis B infection. Hepatitis C  Blood testing is recommended for:  Everyone born from [birth year] through 1967.  Anyone with known risk factors for hepatitis C. Sexually Transmitted Diseases (STDs)  You should be screened each year for STDs including gonorrhea and chlamydia if:  You are sexually active and are younger than 58 years of age.  You are older than 58 years of age and your health care provider tells you that you are at risk for this type of infection.  Your sexual activity has changed since you were last screened and you are at an increased risk for chlamydia or gonorrhea. Ask your health care provider if you are at risk.  Talk with your health care provider about whether you are at high risk of being infected with HIV. Your health care provider may recommend a prescription medicine to help prevent HIV infection. What else can I do?  Schedule regular health, dental, and eye exams.  Stay current with your vaccines (immunizations).  Do not use any tobacco products, such as cigarettes, chewing tobacco, and e-cigarettes. If you need help quitting, ask your health care provider.  Limit  alcohol intake to no more than 2 drinks per day. One drink equals 12 ounces of beer, 5 ounces of wine, or 1 ounces of hard liquor.  Do not use street drugs.  Do not share needles.  Ask your health care provider for help if you need support or information about quitting drugs.  Tell your health care provider if you often feel depressed.  Tell your health care provider if you have ever been abused or do not feel safe at home. This information is not intended to replace advice given to you by your health care provider. Make sure you discuss any questions you have with your health care provider. Document Released: 03/16/2008 Document Revised: 05/17/2016 Document Reviewed: 06/22/2015 Elsevier Interactive Patient Education  2017 [employer].

## 2017-01-25 NOTE — Progress Notes (Signed)
   HPI  Patient presents today here for establishing care.  Patient's having some muscle cramps primarily at night. He also complains of diffuse bone pain. He works a physical job at [employer]  Patient states that he has difficult time sleeping. He falls asleep easily but then has early morning awakenings after 4-5 hours of sleep. He's tried melatonin without much improvement.  He is willing to have a colonoscopy.  He's been smoking for many years, he is not quite ready to quit.  Lung nodules found in 2015 were reviewed, recommended repeat CT scan, patient would like to wait on getting the CT scan. He states that he would be willing to get a chest x-ray.  PMH: Bell's palsy Family history positive for heart disease in father, myasthenia gravis, lung cancer in mother Surgical history: None Social history: Current smoker, 25-pack-year history, does drink alcohol ROS: Per HPI  Objective: BP 139/90   Pulse 75   Temp 97.9 F (36.6 C) (Oral)   Ht '5\' 4"'$  (1.626 m)   Wt 144 lb 12.8 oz (65.7 kg)   BMI 24.85 kg/m  Gen: NAD, alert, cooperative with exam HEENT: NCAT, oropharynx clear and moist, nares clear, TMs obscured bilaterally by cerumen partially CV: RRR, good S1/S2, no murmur Resp: CTABL, no wheezes, non-labored Abd: SNTND, BS present, no guarding or organomegaly Ext: No edema, warm Neuro: Alert and oriented, No gross deficits  Assessment and plan:  # Annual physical exam Normal exam, normal weight. Recommended quitting smoking. Basic labs including cholesterol today, nonfasting, however has not eaten in 6 hours  # Early wakening Trial of trazodone, sleep maintenance issues could be helped by belsomra as well  # Screening for colon cancer Refer to GI for colonoscopy  # Lung nodules Discussed with patient at length. He would like to wait on a CT scan he believes that the lung nodules were representative of infection. He does agree to get a chest x-ray today I did explain  that chest x-rays are not as sensitive to detect lung nodules on CT scans, he will consider getting the CT scan.  Muscle cramps, nighttime cramps Trial of B complex vitamins, could try gabapentin    Orders Placed This Encounter  Procedures  . Lipid panel  . CMP14+EGFR  . CBC with Differential/Platelet  . PSA  . Ambulatory referral to Gastroenterology    Referral Priority:   Routine    Referral Type:   Consultation    Referral Reason:   Specialty Services Required    Number of Visits Requested:   1    Meds ordered this encounter  Medications  . traZODone (DESYREL) 100 MG tablet    Sig: Take 1 tablet (100 mg total) by mouth at bedtime.    Dispense:  30 tablet    Refill:  Hope, MD [facility] 01/25/2017, 1:40 PM

## 2017-01-26 LAB — CBC WITH DIFFERENTIAL/PLATELET
Basophils Absolute: 0 10*3/uL (ref 0.0–0.2)
Basos: 0 %
EOS (ABSOLUTE): 0.6 10*3/uL — ABNORMAL HIGH (ref 0.0–0.4)
EOS: 7 %
HEMATOCRIT: 44.8 % (ref 37.5–51.0)
HEMOGLOBIN: 15.2 g/dL (ref 13.0–17.7)
IMMATURE GRANULOCYTES: 0 %
Immature Grans (Abs): 0 10*3/uL (ref 0.0–0.1)
Lymphocytes Absolute: 3 10*3/uL (ref 0.7–3.1)
Lymphs: 33 %
MCH: 31.3 pg (ref 26.6–33.0)
MCHC: 33.9 g/dL (ref 31.5–35.7)
MCV: 92 fL (ref 79–97)
MONOCYTES: 8 %
Monocytes Absolute: 0.7 10*3/uL (ref 0.1–0.9)
NEUTROS PCT: 52 %
Neutrophils Absolute: 4.7 10*3/uL (ref 1.4–7.0)
Platelets: 252 10*3/uL (ref 150–379)
RBC: 4.86 x10E6/uL (ref 4.14–5.80)
RDW: 14.2 % (ref 12.3–15.4)
WBC: 9 10*3/uL (ref 3.4–10.8)

## 2017-01-26 LAB — CMP14+EGFR
ALBUMIN: 4.4 g/dL (ref 3.5–5.5)
ALK PHOS: 107 IU/L (ref 39–117)
ALT: 22 IU/L (ref 0–44)
AST: 22 IU/L (ref 0–40)
Albumin/Globulin Ratio: 1.8 (ref 1.2–2.2)
BUN/Creatinine Ratio: 14 (ref 9–20)
BUN: 13 mg/dL (ref 6–24)
Bilirubin Total: 0.2 mg/dL (ref 0.0–1.2)
CO2: 22 mmol/L (ref 18–29)
CREATININE: 0.93 mg/dL (ref 0.76–1.27)
Calcium: 9.5 mg/dL (ref 8.7–10.2)
Chloride: 101 mmol/L (ref 96–106)
GFR calc Af Amer: 105 mL/min/{1.73_m2} (ref >59)
GFR calc non Af Amer: 91 mL/min/{1.73_m2} (ref >59)
GLUCOSE: 105 mg/dL — AB (ref 65–99)
Globulin, Total: 2.5 g/dL (ref 1.5–4.5)
Potassium: 4.5 mmol/L (ref 3.5–5.2)
Sodium: 139 mmol/L (ref 134–144)
Total Protein: 6.9 g/dL (ref 6.0–8.5)

## 2017-01-26 LAB — LIPID PANEL
Chol/HDL Ratio: 5.2 ratio — ABNORMAL HIGH (ref 0.0–5.0)
Cholesterol, Total: 198 mg/dL (ref 100–199)
HDL: 38 mg/dL — ABNORMAL LOW (ref >39)
LDL CALC: 119 mg/dL — AB (ref 0–99)
Triglycerides: 206 mg/dL — ABNORMAL HIGH (ref 0–149)
VLDL CHOLESTEROL CAL: 41 mg/dL — AB (ref 5–40)

## 2017-01-26 LAB — PSA: Prostate Specific Ag, Serum: 1.4 ng/mL (ref 0.0–4.0)

## 2017-03-30 ENCOUNTER — Encounter: Payer: Self-pay | Admitting: Family Medicine

## 2017-05-29 ENCOUNTER — Other Ambulatory Visit: Payer: Self-pay | Admitting: Family Medicine

## 2017-10-10 ENCOUNTER — Other Ambulatory Visit: Payer: Self-pay | Admitting: Family Medicine

## 2017-10-10 NOTE — Telephone Encounter (Signed)
Last seen 01/25/17  Dr Bradshaw 

## 2018-02-12 ENCOUNTER — Other Ambulatory Visit: Payer: Self-pay | Admitting: Family Medicine

## 2018-02-12 NOTE — Telephone Encounter (Signed)
LAST SEEN 01/26/28  Dr Ermalinda Memos

## 2018-10-07 IMAGING — DX DG CHEST 2V
2 series · 2 of 2 positions shown · non-contrast
Comparison: Chest CT and chest radiographs 02/18/2014.

CLINICAL DATA: 55-year-old male with small bilateral lung nodules
seen by CT in 1820. Smoker.

EXAM:
CHEST  2 VIEW

[chest pa]
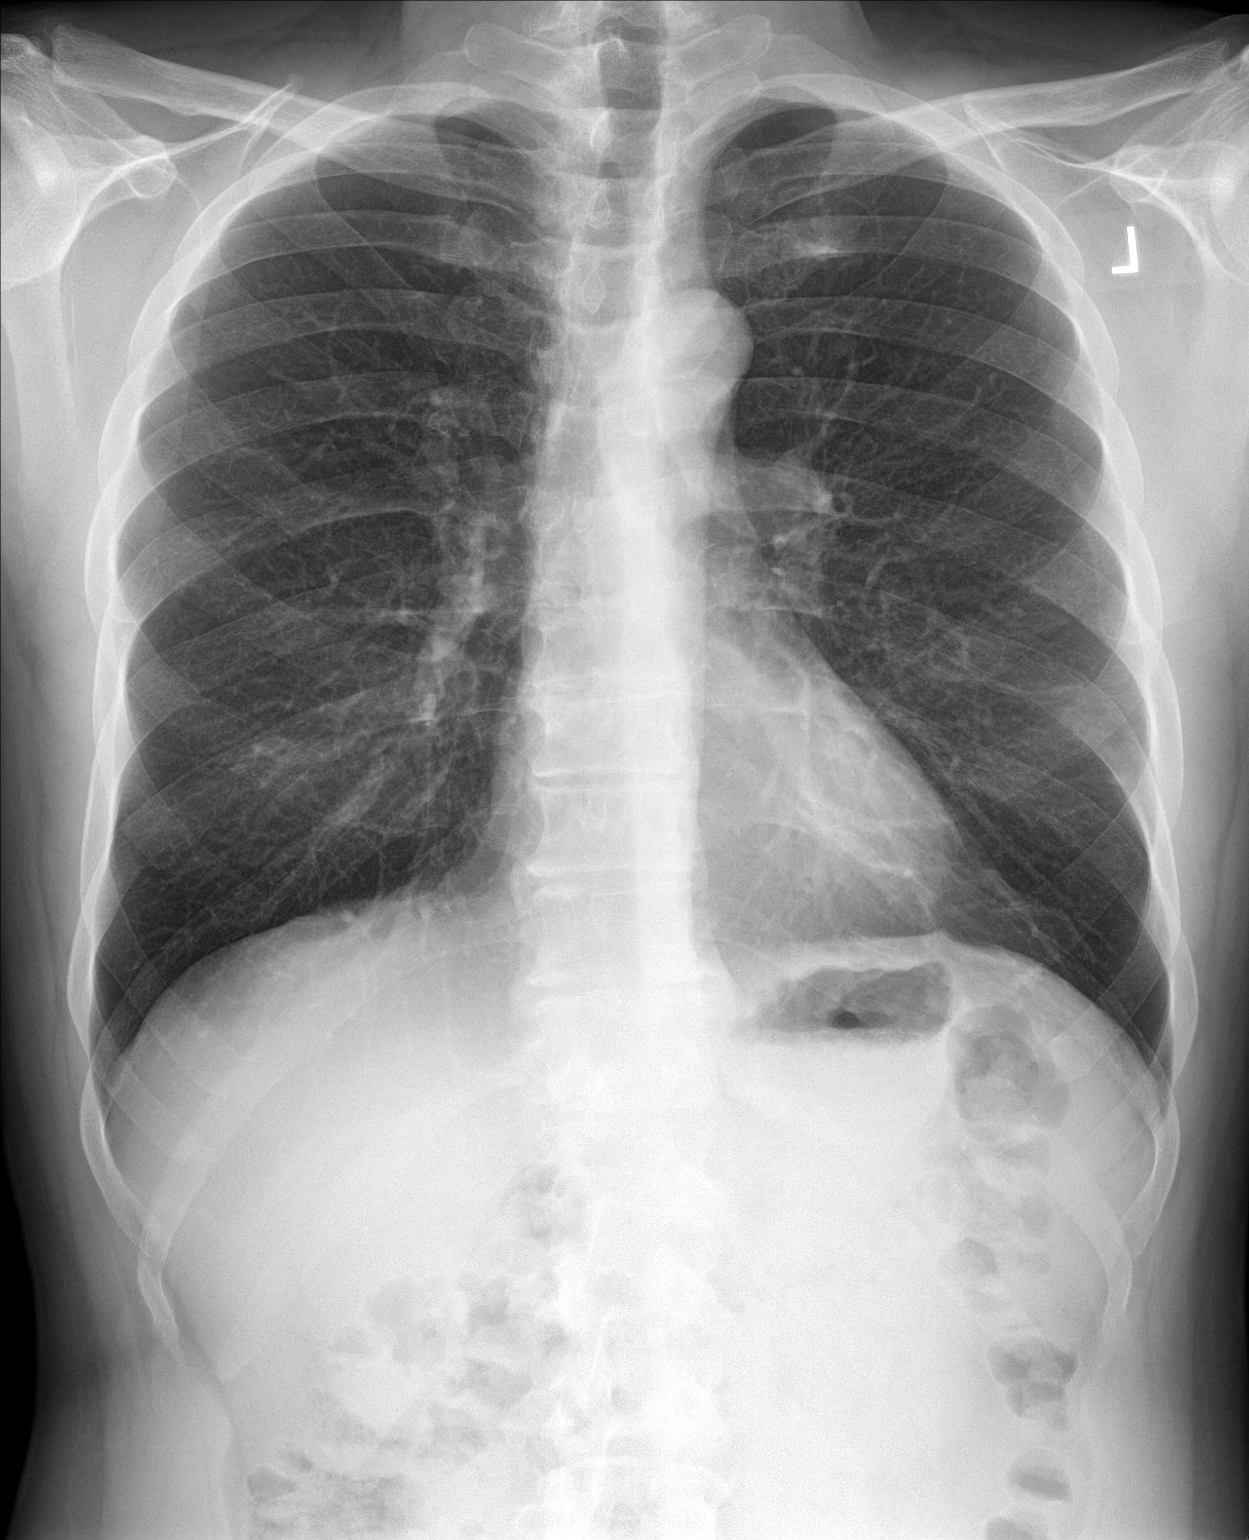

[chest lat]
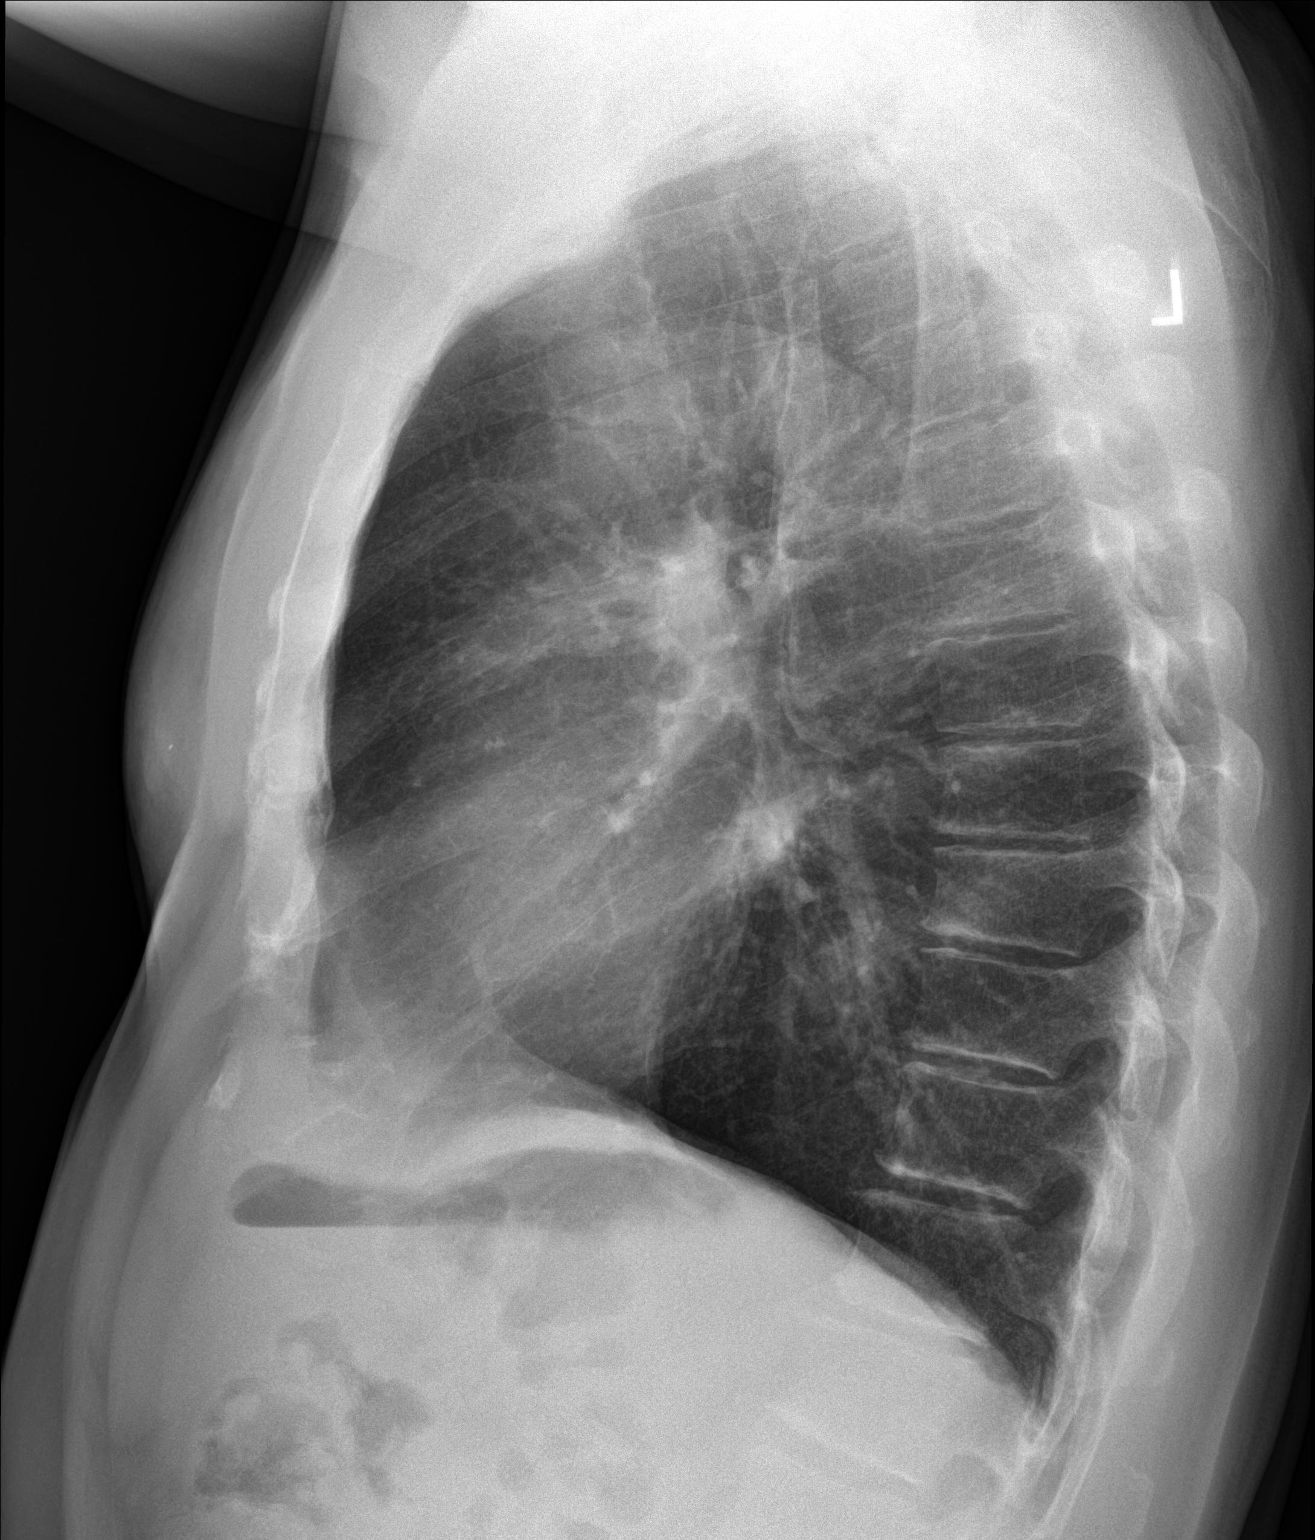

[2 of 2 positions shown; findings below may reference images not displayed]

FINDINGS: Mildly larger lung volumes compared to the prior radiographs. The
lung nodules seen by CT in 1820 remain radiographically occult. No
pneumothorax, pulmonary edema, pleural effusion or confluent
pulmonary opacity. Mediastinal contours remain normal. Visualized
tracheal air column is within normal limits. No acute osseous
abnormality identified. Negative visible bowel gas pattern.
IMPRESSION: 1. Small bilateral pulmonary nodules seen by CT in 1820 remain
radiographically occult and were therefore likely benign.
2. Mild pulmonary hyperinflation.
3.  No acute cardiopulmonary abnormality.
